# Patient Record
Sex: Female | Born: 1970 | Race: White | Hispanic: No | Marital: Single | Smoking: Never smoker
Health system: Southern US, Community
[De-identification: ages and names within clinical notes are randomized; demographics above are authoritative.]

## PROBLEM LIST (undated history)

## (undated) DIAGNOSIS — N201 Calculus of ureter: Secondary | ICD-10-CM

## (undated) DIAGNOSIS — Z9289 Personal history of other medical treatment: Secondary | ICD-10-CM

## (undated) DIAGNOSIS — N189 Chronic kidney disease, unspecified: Secondary | ICD-10-CM

## (undated) DIAGNOSIS — I429 Cardiomyopathy, unspecified: Secondary | ICD-10-CM

## (undated) DIAGNOSIS — I10 Essential (primary) hypertension: Secondary | ICD-10-CM

## (undated) HISTORY — PX: CARPAL TUNNEL RELEASE: SHX101

---

## 1999-04-14 ENCOUNTER — Ambulatory Visit (HOSPITAL_BASED_OUTPATIENT_CLINIC_OR_DEPARTMENT_OTHER): Admission: RE | Admit: 1999-04-14 | Discharge: 1999-04-14 | Payer: Self-pay | Admitting: Orthopedic Surgery

## 2020-05-15 ENCOUNTER — Other Ambulatory Visit: Payer: Self-pay | Admitting: Nephrology

## 2020-05-15 ENCOUNTER — Other Ambulatory Visit (HOSPITAL_COMMUNITY): Payer: Self-pay | Admitting: Nephrology

## 2020-05-15 DIAGNOSIS — R600 Localized edema: Secondary | ICD-10-CM

## 2020-05-15 DIAGNOSIS — R7989 Other specified abnormal findings of blood chemistry: Secondary | ICD-10-CM

## 2020-05-15 DIAGNOSIS — I129 Hypertensive chronic kidney disease with stage 1 through stage 4 chronic kidney disease, or unspecified chronic kidney disease: Secondary | ICD-10-CM

## 2020-05-15 DIAGNOSIS — R809 Proteinuria, unspecified: Secondary | ICD-10-CM

## 2020-05-22 ENCOUNTER — Telehealth: Payer: Self-pay | Admitting: Oncology

## 2020-05-22 NOTE — Telephone Encounter (Signed)
Ms. Laton returned my call and has been scheduled to see Dr. Alen Blew on 2/4 at 11am. Pt aware to arrive 20 minutes early.

## 2020-06-06 ENCOUNTER — Ambulatory Visit
Admission: RE | Admit: 2020-06-06 | Discharge: 2020-06-06 | Disposition: A | Discharge status: Home / Self Care | Payer: Self-pay | Source: Ambulatory Visit | Attending: Nephrology | Admitting: Nephrology

## 2020-06-06 ENCOUNTER — Inpatient Hospital Stay: Payer: 59 | Attending: Oncology | Admitting: Oncology

## 2020-06-06 ENCOUNTER — Other Ambulatory Visit: Payer: Self-pay

## 2020-06-06 VITALS — BP 178/93 | HR 62 | Temp 97.6°F | Resp 19 | Wt 177.3 lb

## 2020-06-06 DIAGNOSIS — N19 Unspecified kidney failure: Secondary | ICD-10-CM | POA: Diagnosis not present

## 2020-06-06 DIAGNOSIS — I429 Cardiomyopathy, unspecified: Secondary | ICD-10-CM | POA: Diagnosis not present

## 2020-06-06 DIAGNOSIS — I10 Essential (primary) hypertension: Secondary | ICD-10-CM | POA: Diagnosis not present

## 2020-06-06 DIAGNOSIS — R7989 Other specified abnormal findings of blood chemistry: Secondary | ICD-10-CM

## 2020-06-06 DIAGNOSIS — R778 Other specified abnormalities of plasma proteins: Secondary | ICD-10-CM

## 2020-06-06 DIAGNOSIS — Z856 Personal history of leukemia: Secondary | ICD-10-CM

## 2020-06-06 DIAGNOSIS — R809 Proteinuria, unspecified: Secondary | ICD-10-CM

## 2020-06-06 DIAGNOSIS — Z806 Family history of leukemia: Secondary | ICD-10-CM | POA: Diagnosis not present

## 2020-06-06 NOTE — Progress Notes (Signed)
Reason for the request:    Abnormal serum light chains  HPI: I was asked by Dr. Candiss Norse to evaluate Heidi Harrison the evaluation of abnormal protein studies.  This is a 50 year old woman with a history of hypertension and cardiomyopathy who was evaluated by Dr. Candiss Norse at [facility] for renal insufficiency.  She noted symptoms of lower extremity edema and work-up showed a creatinine of 2.77 and a calcium of 10.4.  She had normal protein and albumin studies as well as a normal serum protein electrophoresis.  She had a normal CBC including hemoglobin of 14.8, white cell count of 8.6 and platelet count of 370.  Her light chain immunoglobulins showed an elevated free kappa of 119 and free lambda at 89.2.  The kappa to lambda ratio is normal at 1.34.   Clinically, she has reported improvement in her lower extremity edema and otherwise no other complaints.  She denies any bone pain, pathological fractures or recent hospitalizations.  She does not report any headaches, blurry vision, syncope or seizures. Does not report any fevers, chills or sweats.  Does not report any cough, wheezing or hemoptysis.  Does not report any chest pain, palpitation, orthopnea or leg edema.  Does not report any nausea, vomiting or abdominal pain.  Does not report any constipation or diarrhea.  Does not report any skeletal complaints.    Does not report frequency, urgency or hematuria.  Does not report any skin rashes or lesions. Does not report any heat or cold intolerance.  Does not report any lymphadenopathy or petechiae.  Does not report any anxiety or depression.  Remaining review of systems is negative.    Past medical history significant for hypertension and cardiomegaly.  Medications: She is currently on lisinopril and hydrochlorothiazide.    Her mother has leukemia but no other malignancy noted.  Social History   Socioeconomic History  . Marital status: Single    Spouse name: Not on file  . Number of  children: Not on file  . Years of education: Not on file  . Highest education level: Not on file  Occupational History  . Not on file  Tobacco Use  . Smoking status: Not on file  . Smokeless tobacco: Not on file  Substance and Sexual Activity  . Alcohol use: Not on file  . Drug use: Not on file  . Sexual activity: Not on file  Other Topics Concern  . Not on file  Social History Narrative  . Not on file   Social Determinants of Health   Financial Resource Strain: Not on file  Food Insecurity: Not on file  Transportation Needs: Not on file  Physical Activity: Not on file  Stress: Not on file  Social Connections: Not on file  Intimate Partner Violence: Not on file  :    Exam: Blood pressure (!) 178/93, pulse 62, temperature 97.6 F (36.4 C), temperature source Tympanic, resp. rate 19, weight 177 lb 4.8 oz (80.4 kg), SpO2 99 %.  ECOG 0 General appearance: alert and cooperative appeared without distress. Head: atraumatic without any abnormalities. Eyes: conjunctivae/corneas clear. PERRL.  Sclera anicteric. Throat: lips, mucosa, and tongue normal; without oral thrush or ulcers. Resp: clear to auscultation bilaterally without rhonchi, wheezes or dullness to percussion. Cardio: regular rate and rhythm, S1, S2 normal, no murmur, click, rub or gallop GI: soft, non-tender; bowel sounds normal; no masses,  no organomegaly Skin: Skin color, texture, turgor normal. No rashes or lesions Lymph nodes: Cervical, supraclavicular, and axillary nodes normal.  Neurologic: Grossly normal without any motor, sensory or deep tendon reflexes. Musculoskeletal: No joint deformity or effusion.    Assessment and Plan:   50 year old with:  1.  Elevated serum kappa and lambda light chains detected in January 2022.  This is in the setting of worsening renal failure and abnormal serum protein electrophoresis.  Her kappa light chains were 119 and free lambda at 89.2.  The kappa to lambda ratio is  normal at 1.34.  He has normal CBC, albumin and protein.  The natural course of these findings as well as a differential diagnosis was discussed at this time.  The pattern of elevation in her light chains is likely consistent with reactive findings rather than a plasma cell disorder.  Conditions such as MGUS multiple myeloma or amyloidosis are considered less likely.  Given her renal failure and proteinuria it is common to see these findings manifesting without a plasma cell disorder.  For completeness sake, I will repeat these testing in 6 months and reevaluate at this time for potential plasma cell disorder.  Her labs are consistent with these findings, no further hematological work-up including a bone marrow biopsy or skeletal survey is needed.  She is agreeable with this plan and understands management choices for the future.  2.  Renal failure: Unrelated to plasma cell disorder likely related to longstanding hypertension.  3.  Follow-up: Will be in 6 months after repeat laboratory testing.   45  minutes were dedicated to this visit. The time was spent on reviewing laboratory data, discussing treatment options, discussing differential diagnosis and answering questions regarding future plan.     A copy of this consult has been forwarded to the requesting physician.

## 2020-06-23 ENCOUNTER — Other Ambulatory Visit: Payer: Self-pay | Admitting: Nephrology

## 2020-06-23 DIAGNOSIS — N1330 Unspecified hydronephrosis: Secondary | ICD-10-CM

## 2020-06-26 ENCOUNTER — Inpatient Hospital Stay: Admission: RE | Admit: 2020-06-26 | Payer: 59 | Source: Ambulatory Visit

## 2020-07-25 ENCOUNTER — Other Ambulatory Visit (HOSPITAL_COMMUNITY): Payer: Self-pay | Admitting: Urology

## 2020-07-25 ENCOUNTER — Ambulatory Visit
Admission: RE | Admit: 2020-07-25 | Discharge: 2020-07-25 | Disposition: A | Discharge status: Home / Self Care | Payer: 59 | Source: Ambulatory Visit | Attending: Nephrology | Admitting: Nephrology

## 2020-07-25 ENCOUNTER — Other Ambulatory Visit: Payer: Self-pay | Admitting: Urology

## 2020-07-25 DIAGNOSIS — N1330 Unspecified hydronephrosis: Secondary | ICD-10-CM

## 2020-07-25 DIAGNOSIS — N130 Hydronephrosis with ureteropelvic junction obstruction: Secondary | ICD-10-CM

## 2020-08-05 ENCOUNTER — Other Ambulatory Visit: Payer: Self-pay

## 2020-08-05 ENCOUNTER — Ambulatory Visit (HOSPITAL_COMMUNITY)
Admission: RE | Admit: 2020-08-05 | Discharge: 2020-08-05 | Disposition: A | Discharge status: Home / Self Care | Payer: 59 | Source: Ambulatory Visit | Attending: Urology | Admitting: Urology

## 2020-08-05 DIAGNOSIS — N130 Hydronephrosis with ureteropelvic junction obstruction: Secondary | ICD-10-CM | POA: Insufficient documentation

## 2020-08-05 MED ORDER — TECHNETIUM TC 99M MERTIATIDE
4.8000 | Freq: Once | INTRAVENOUS | Status: AC
  Administered 2020-08-05: 4.8 via INTRAVENOUS

## 2020-08-05 MED ORDER — FUROSEMIDE 10 MG/ML IJ SOLN
INTRAMUSCULAR | Status: AC
  Administered 2020-08-05: 40 mg via INTRAVENOUS
  Filled 2020-08-05: qty 4

## 2020-08-05 MED ORDER — FUROSEMIDE 10 MG/ML IJ SOLN: 40.0000 mg | Freq: Once | INTRAMUSCULAR | Status: AC

## 2020-08-13 ENCOUNTER — Other Ambulatory Visit: Payer: Self-pay | Admitting: Urology

## 2020-08-27 ENCOUNTER — Other Ambulatory Visit: Payer: Self-pay

## 2020-08-27 ENCOUNTER — Encounter (HOSPITAL_BASED_OUTPATIENT_CLINIC_OR_DEPARTMENT_OTHER): Payer: Self-pay | Admitting: Urology

## 2020-08-27 NOTE — Progress Notes (Signed)
Spoke w/ via phone for pre-op interview---pt Lab needs dos----    I stat,  urine poct           Lab results------see below COVID test ------08-29-2020 830 Arrive at -------1200 pm 09-01-2020 NPO after MN NO Solid Food.  Clear liquids from MN until---1100 am then npo Med rec completed Medications to take morning of surgery -----labetalol, diltiazem Diabetic medication ----- n/a Patient instructed to bring photo id and insurance card day of surgery Patient aware to have Driver (ride ) / caregiver  Significant other beth spainhour will stay   for 24 hours after surgery  Patient Special Instructions -----none Pre-Op special Istructions -----none Patient verbalized understanding of instructions that were given at this phone interview. Patient denies shortness of breath, chest pain, fever, cough at this phone interview.  ekg 05-06-2020 [facility] cardiology on chart Echo bethany cardiology 06-10-2020 on chart lov dr Trina Ao cardiology 07-31-2020 on chart

## 2020-08-29 ENCOUNTER — Other Ambulatory Visit (HOSPITAL_COMMUNITY)
Admission: RE | Admit: 2020-08-29 | Discharge: 2020-08-29 | Disposition: A | Discharge status: Home / Self Care | Payer: 59 | Source: Ambulatory Visit | Attending: Urology | Admitting: Urology

## 2020-08-29 DIAGNOSIS — Z01812 Encounter for preprocedural laboratory examination: Secondary | ICD-10-CM | POA: Diagnosis present

## 2020-08-29 DIAGNOSIS — Z20822 Contact with and (suspected) exposure to covid-19: Secondary | ICD-10-CM | POA: Insufficient documentation

## 2020-08-29 LAB — SARS CORONAVIRUS 2 (TAT 6-24 HRS): SARS Coronavirus 2: NEGATIVE

## 2020-09-01 ENCOUNTER — Other Ambulatory Visit: Payer: Self-pay

## 2020-09-01 ENCOUNTER — Encounter (HOSPITAL_BASED_OUTPATIENT_CLINIC_OR_DEPARTMENT_OTHER)
Admission: RE | Disposition: A | Discharge status: Home / Self Care | Payer: Self-pay | Source: Home / Self Care | Attending: Urology

## 2020-09-01 ENCOUNTER — Ambulatory Visit (HOSPITAL_BASED_OUTPATIENT_CLINIC_OR_DEPARTMENT_OTHER)
Admission: RE | Admit: 2020-09-01 | Discharge: 2020-09-01 | Disposition: A | Discharge status: Home / Self Care | Payer: 59 | Attending: Urology | Admitting: Urology

## 2020-09-01 ENCOUNTER — Ambulatory Visit (HOSPITAL_BASED_OUTPATIENT_CLINIC_OR_DEPARTMENT_OTHER): Payer: 59 | Admitting: Anesthesiology

## 2020-09-01 ENCOUNTER — Encounter (HOSPITAL_BASED_OUTPATIENT_CLINIC_OR_DEPARTMENT_OTHER): Payer: Self-pay | Admitting: Urology

## 2020-09-01 DIAGNOSIS — N132 Hydronephrosis with renal and ureteral calculous obstruction: Secondary | ICD-10-CM | POA: Insufficient documentation

## 2020-09-01 DIAGNOSIS — Z79899 Other long term (current) drug therapy: Secondary | ICD-10-CM | POA: Insufficient documentation

## 2020-09-01 HISTORY — DX: Chronic kidney disease, unspecified: N18.9

## 2020-09-01 HISTORY — DX: Personal history of other medical treatment: Z92.89

## 2020-09-01 HISTORY — PX: HOLMIUM LASER APPLICATION: SHX5852

## 2020-09-01 HISTORY — DX: Essential (primary) hypertension: I10

## 2020-09-01 HISTORY — DX: Calculus of ureter: N20.1

## 2020-09-01 HISTORY — DX: Cardiomyopathy, unspecified: I42.9

## 2020-09-01 HISTORY — PX: CYSTOSCOPY/URETEROSCOPY/HOLMIUM LASER/STENT PLACEMENT: SHX6546

## 2020-09-01 LAB — POCT I-STAT, CHEM 8
BUN: 56 mg/dL — ABNORMAL HIGH (ref 6–20)
Calcium, Ion: 1.22 mmol/L (ref 1.15–1.40)
Chloride: 99 mmol/L (ref 98–111)
Creatinine, Ser: 3.6 mg/dL — ABNORMAL HIGH (ref 0.44–1.00)
Glucose, Bld: 86 mg/dL (ref 70–99)
HCT: 42 % (ref 36.0–46.0)
Hemoglobin: 14.3 g/dL (ref 12.0–15.0)
Potassium: 3.3 mmol/L — ABNORMAL LOW (ref 3.5–5.1)
Sodium: 139 mmol/L (ref 135–145)
TCO2: 28 mmol/L (ref 22–32)

## 2020-09-01 LAB — POCT PREGNANCY, URINE: Preg Test, Ur: NEGATIVE

## 2020-09-01 SURGERY — CYSTOSCOPY/URETEROSCOPY/HOLMIUM LASER/STENT PLACEMENT
Anesthesia: General | Site: Renal | Laterality: Left

## 2020-09-01 MED ORDER — LIDOCAINE 2% (20 MG/ML) 5 ML SYRINGE
INTRAMUSCULAR | Status: AC
  Filled 2020-09-01: qty 5

## 2020-09-01 MED ORDER — SODIUM CHLORIDE 0.9 % IR SOLN
Status: DC | PRN
  Administered 2020-09-01 (×2): 3000 mL

## 2020-09-01 MED ORDER — MEPERIDINE HCL 25 MG/ML IJ SOLN: 6.2500 mg | INTRAMUSCULAR | Status: DC | PRN

## 2020-09-01 MED ORDER — DEXAMETHASONE SODIUM PHOSPHATE 10 MG/ML IJ SOLN
INTRAMUSCULAR | Status: AC
  Filled 2020-09-01: qty 1

## 2020-09-01 MED ORDER — PROPOFOL 10 MG/ML IV BOLUS
INTRAVENOUS | Status: DC | PRN
  Administered 2020-09-01: 50 mg via INTRAVENOUS
  Administered 2020-09-01: 150 mg via INTRAVENOUS

## 2020-09-01 MED ORDER — TAMSULOSIN HCL 0.4 MG PO CAPS: 0.4000 mg | ORAL_CAPSULE | Freq: Every day | ORAL | 1 refills | Status: DC

## 2020-09-01 MED ORDER — PROPOFOL 10 MG/ML IV BOLUS
INTRAVENOUS | Status: AC
  Filled 2020-09-01: qty 20

## 2020-09-01 MED ORDER — CEFAZOLIN SODIUM-DEXTROSE 2-4 GM/100ML-% IV SOLN
INTRAVENOUS | Status: AC
  Filled 2020-09-01: qty 100

## 2020-09-01 MED ORDER — LIDOCAINE 2% (20 MG/ML) 5 ML SYRINGE
INTRAMUSCULAR | Status: DC | PRN
  Administered 2020-09-01: 60 mg via INTRAVENOUS

## 2020-09-01 MED ORDER — HYDRALAZINE HCL 20 MG/ML IJ SOLN: 5.0000 mg | Freq: Once | INTRAMUSCULAR | Status: DC

## 2020-09-01 MED ORDER — HYDROCODONE-ACETAMINOPHEN 5-325 MG PO TABS: 1.0000 | ORAL_TABLET | ORAL | 0 refills | Status: DC | PRN

## 2020-09-01 MED ORDER — HYDRALAZINE HCL 20 MG/ML IJ SOLN
INTRAMUSCULAR | Status: AC
  Filled 2020-09-01: qty 1

## 2020-09-01 MED ORDER — LACTATED RINGERS IV SOLN: INTRAVENOUS | Status: DC

## 2020-09-01 MED ORDER — ARTIFICIAL TEARS OPHTHALMIC OINT
TOPICAL_OINTMENT | OPHTHALMIC | Status: AC
  Filled 2020-09-01: qty 3.5

## 2020-09-01 MED ORDER — FENTANYL CITRATE (PF) 100 MCG/2ML IJ SOLN
INTRAMUSCULAR | Status: AC
  Filled 2020-09-01: qty 2

## 2020-09-01 MED ORDER — CEFAZOLIN SODIUM-DEXTROSE 2-4 GM/100ML-% IV SOLN
2.0000 g | INTRAVENOUS | Status: AC
  Administered 2020-09-01: 2 g via INTRAVENOUS

## 2020-09-01 MED ORDER — FENTANYL CITRATE (PF) 100 MCG/2ML IJ SOLN
INTRAMUSCULAR | Status: DC | PRN
  Administered 2020-09-01 (×2): 50 ug via INTRAVENOUS

## 2020-09-01 MED ORDER — LABETALOL HCL 5 MG/ML IV SOLN
INTRAVENOUS | Status: AC
  Filled 2020-09-01: qty 4

## 2020-09-01 MED ORDER — OXYCODONE HCL 5 MG PO TABS: 5.0000 mg | ORAL_TABLET | Freq: Once | ORAL | Status: DC | PRN

## 2020-09-01 MED ORDER — MIDAZOLAM HCL 2 MG/2ML IJ SOLN
INTRAMUSCULAR | Status: DC | PRN
  Administered 2020-09-01 (×2): 1 mg via INTRAVENOUS

## 2020-09-01 MED ORDER — ACETAMINOPHEN 160 MG/5ML PO SOLN: 325.0000 mg | ORAL | Status: DC | PRN

## 2020-09-01 MED ORDER — ONDANSETRON HCL 4 MG/2ML IJ SOLN
INTRAMUSCULAR | Status: AC
  Filled 2020-09-01: qty 2

## 2020-09-01 MED ORDER — OXYCODONE HCL 5 MG/5ML PO SOLN: 5.0000 mg | Freq: Once | ORAL | Status: DC | PRN

## 2020-09-01 MED ORDER — MIDAZOLAM HCL 2 MG/2ML IJ SOLN
INTRAMUSCULAR | Status: AC
  Filled 2020-09-01: qty 2

## 2020-09-01 MED ORDER — FENTANYL CITRATE (PF) 100 MCG/2ML IJ SOLN: 25.0000 ug | INTRAMUSCULAR | Status: DC | PRN

## 2020-09-01 MED ORDER — IOHEXOL 300 MG/ML  SOLN
INTRAMUSCULAR | Status: DC | PRN
  Administered 2020-09-01: 10 mL

## 2020-09-01 MED ORDER — ACETAMINOPHEN 325 MG PO TABS: 325.0000 mg | ORAL_TABLET | ORAL | Status: DC | PRN

## 2020-09-01 MED ORDER — DEXAMETHASONE SODIUM PHOSPHATE 10 MG/ML IJ SOLN
INTRAMUSCULAR | Status: DC | PRN
  Administered 2020-09-01: 10 mg via INTRAVENOUS

## 2020-09-01 MED ORDER — ONDANSETRON HCL 4 MG/2ML IJ SOLN
INTRAMUSCULAR | Status: DC | PRN
  Administered 2020-09-01: 4 mg via INTRAVENOUS

## 2020-09-01 MED ORDER — ONDANSETRON HCL 4 MG/2ML IJ SOLN: 4.0000 mg | Freq: Once | INTRAMUSCULAR | Status: DC | PRN

## 2020-09-01 MED ORDER — LABETALOL HCL 5 MG/ML IV SOLN
20.0000 mg | Freq: Once | INTRAVENOUS | Status: AC
  Administered 2020-09-01: 20 mg via INTRAVENOUS

## 2020-09-01 MED ORDER — PHENYLEPHRINE 40 MCG/ML (10ML) SYRINGE FOR IV PUSH (FOR BLOOD PRESSURE SUPPORT)
PREFILLED_SYRINGE | INTRAVENOUS | Status: AC
  Filled 2020-09-01: qty 10

## 2020-09-01 SURGICAL SUPPLY — 25 items
BAG DRAIN URO-CYSTO SKYTR STRL (DRAIN) ×2 IMPLANT
BAG DRN UROCATH (DRAIN) ×1
BASKET ZERO TIP NITINOL 2.4FR (BASKET) ×2 IMPLANT
BSKT STON RTRVL ZERO TP 2.4FR (BASKET) ×1
CATH INTERMIT  6FR 70CM (CATHETERS) ×2 IMPLANT
CLOTH BEACON ORANGE TIMEOUT ST (SAFETY) ×2 IMPLANT
FIBER LASER FLEXIVA 365 (UROLOGICAL SUPPLIES) IMPLANT
GLOVE SURG ENC MOIS LTX SZ7.5 (GLOVE) ×2 IMPLANT
GLOVE SURG LTX SZ6.5 (GLOVE) ×4 IMPLANT
GLOVE SURG UNDER POLY LF SZ6.5 (GLOVE) ×4 IMPLANT
GOWN STRL REUS W/TWL LRG LVL3 (GOWN DISPOSABLE) ×2 IMPLANT
GOWN STRL REUS W/TWL XL LVL3 (GOWN DISPOSABLE) ×2 IMPLANT
GUIDEWIRE STR DUAL SENSOR (WIRE) ×2 IMPLANT
GUIDEWIRE ZIPWRE .038 STRAIGHT (WIRE) ×2 IMPLANT
IV NS IRRIG 3000ML ARTHROMATIC (IV SOLUTION) ×4 IMPLANT
KIT TURNOVER CYSTO (KITS) ×2 IMPLANT
MANIFOLD NEPTUNE II (INSTRUMENTS) ×2 IMPLANT
NS IRRIG 500ML POUR BTL (IV SOLUTION) ×2 IMPLANT
PACK CYSTO (CUSTOM PROCEDURE TRAY) ×2 IMPLANT
SHEATH URET ACCESS 12FR/35CM (UROLOGICAL SUPPLIES) ×2 IMPLANT
STENT URET 6FRX24 CONTOUR (STENTS) ×2 IMPLANT
TRACTIP FLEXIVA PULS ID 200XHI (Laser) ×1 IMPLANT
TRACTIP FLEXIVA PULSE ID 200 (Laser) ×2
TUBE CONNECTING 12X1/4 (SUCTIONS) ×2 IMPLANT
TUBING UROLOGY SET (TUBING) ×2 IMPLANT

## 2020-09-01 NOTE — Progress Notes (Signed)
Dr Ambrose Pancoast aware of BP 207/100 and meds taken ttoday. Orders given.

## 2020-09-01 NOTE — Anesthesia Preprocedure Evaluation (Addendum)
Anesthesia Evaluation  Patient identified by MRN, date of birth, ID band Patient awake    Reviewed: Allergy & Precautions, H&P , NPO status , Patient's Chart, lab work & pertinent test results, reviewed documented beta blocker date and time   Airway Mallampati: II  TM Distance: >3 FB Neck ROM: full    Dental no notable dental hx. (+) Teeth Intact, Dental Advisory Given, Implants,    Pulmonary    Pulmonary exam normal breath sounds clear to auscultation       Cardiovascular Exercise Tolerance: Good hypertension, Pt. on medications and Pt. on home beta blockers  Rhythm:regular Rate:Normal  ECHO 21'  Concentric LVH with EF 70% Valves ok   Neuro/Psych negative psych ROS   GI/Hepatic   Endo/Other    Renal/GU Renal disease     Musculoskeletal   Abdominal (+) + obese,   Peds  Hematology   Anesthesia Other Findings   Reproductive/Obstetrics                           Anesthesia Physical Anesthesia Plan  ASA: III  Anesthesia Plan: General   Post-op Pain Management:    Induction: Intravenous  PONV Risk Score and Plan: 3 and Ondansetron and Dexamethasone  Airway Management Planned: LMA  Additional Equipment: None  Intra-op Plan:   Post-operative Plan:   Informed Consent: I have reviewed the patients History and Physical, chart, labs and discussed the procedure including the risks, benefits and alternatives for the proposed anesthesia with the patient or authorized representative who has indicated his/her understanding and acceptance.     Dental Advisory Given  Plan Discussed with: CRNA and Anesthesiologist  Anesthesia Plan Comments: ( )        Anesthesia Quick Evaluation

## 2020-09-01 NOTE — Op Note (Signed)
Operative Note  Preoperative diagnosis:  1.  Left ureteral calculus  Postoperative diagnosis: 1.  Left ureteral calculus  Procedure(s): 1.  Cystoscopy with left retrograde pyelogram, left ureteroscopy with laser lithotripsy and stone basketing, ureteral stent placement  Surgeon: Link Snuffer, MD  Assistants: None  Anesthesia: General  Complications: None immediate  EBL: Minimal  Specimens: 1.  None  Drains/Catheters: 1.  6 x 24 double-J ureteral stent  Intraoperative findings: 1.  Normal urethra and bladder 2.  Left retrograde pyelogram revealed severe hydronephrosis. 3.  Ureteroscopy revealed a significantly impacted stone in the proximal ureter with significant periureteral inflammation.  There was severe hydronephrosis within the kidney.  I did not ever visualize a lower pole calculus but all visualized stone was fragmented to tiny fragments and larger fragments were basket extracted.  Indication: 50 year old female with renal insufficiency underwent ultrasound that revealed bilateral hydronephrosis.  She underwent subsequent CT scan that revealed no hydronephrosis on the right but she had severe hydronephrosis on the left with an obstructing 2 cm ureteropelvic junction calculus.  She presents for the previously mentioned operation.  Description of procedure:  The patient was identified and consent was obtained.  The patient was taken to the operating room and placed in the supine position.  The patient was placed under general anesthesia.  Perioperative antibiotics were administered.  The patient was placed in dorsal lithotomy.  Patient was prepped and draped in a standard sterile fashion and a timeout was performed.  A 21 French rigid cystoscope was advanced into the urethra and into the bladder.  Complete cystoscopy was performed with no abnormal findings.  A Glidewire was advanced up the ureter and into the kidney under fluoroscopic guidance.  Semirigid ureteroscopy was  performed up to the stone of interest which was dusted to smaller fragments.  Once the ureter was clear, I advanced a sensor wire through the scope and into the kidney.  I secured that wire to the drape after withdrawing the scope.  I advanced a 12 x 14 access sheath over the Glidewire under continuous fluoroscopic guidance and withdrew the inner sheath along with the wire.  Digital ureteroscopy was performed and retropulsed larger stones were dusted to small fragments.  I inspected the lower pole of the kidney and did not ever identify a lower pole calculus.  All stone that identified were dusted to small fragments.  I shot a retrograde pyelogram through the scope with findings noted above.  I then used a basket to basket extract any larger fragments including a couple of 2 mm fragments within the ureter.  There was no injury noted along the course of the ureter.  I backloaded the wire onto the cystoscope and advanced that into the bladder followed by routine placement of a 6 x 24 double-J ureteral stent.  Fluoroscopy confirmed proximal placement and direct visualization confirmed a good coil within the bladder.  I drained the bladder and withdrew the scope.  Patient tolerated the procedure well and was stable postoperative.  Plan: Return in 1 week for stent removal.

## 2020-09-01 NOTE — Transfer of Care (Signed)
  Immediate Anesthesia Transfer of Care Note  Patient: [employer]  Procedure(s) Performed: Procedure(s) (LRB): CYSTOSCOPY LEFT URETEROSCOPY/ RETROGRADE / HOLMIUM LASER LITHOTRIPSY/ STONE BASKETRY/ STENT PLACEMENT (Left) HOLMIUM LASER APPLICATION (Left)  Patient Location: PACU  Anesthesia Type: General  Level of Consciousness: awake, alert  and oriented  Airway & Oxygen Therapy: Patient Spontanous Breathing and Patient connected to nasal cannula oxygen  Post-op Assessment: Report given to PACU RN and Post -op Vital signs reviewed and stable  Post vital signs: Reviewed and stable  Complications: No apparent anesthesia complications Last Vitals:  Vitals Value Taken Time  BP    Temp    Pulse 59 09/01/20 1500  Resp 15 09/01/20 1500  SpO2 98 % 09/01/20 1500  Vitals shown include unvalidated device data.  Last Pain:  Vitals:   09/01/20 1250  TempSrc: Oral         Complications: No complications documented.

## 2020-09-01 NOTE — Discharge Instructions (Addendum)
Post Anesthesia Home Care Instructions  Activity: Get plenty of rest for the remainder of the day. A responsible adult should stay with you for 24 hours following the procedure.  For the next 24 hours, DO NOT: -Drive a car -Paediatric nurse -Drink alcoholic beverages -Take any medication unless instructed by your physician -Make any legal decisions or sign important papers.  Meals: Start with liquid foods such as gelatin or soup. Progress to regular foods as tolerated. Avoid greasy, spicy, heavy foods. If nausea and/or vomiting occur, drink only clear liquids until the nausea and/or vomiting subsides. Call your physician if vomiting continues.  Special Instructions/Symptoms: Your throat may feel dry or sore from the anesthesia or the breathing tube placed in your throat during surgery. If this causes discomfort, gargle with warm salt water. The discomfort should disappear within 24 hours.       [facility] [phone] Post Ureteroscopy With or Without Stent Instructions  Definitions:  Ureter: The duct that transports urine from the kidney to the bladder. Stent:   A plastic hollow tube that is placed into the ureter, from the kidney to the                 bladder to prevent the ureter from swelling shut.  GENERAL INSTRUCTIONS:  Despite the fact that no skin incisions were used, the area around the ureter and bladder is raw and irritated. The stent is a foreign body which will further irritate the bladder wall. This irritation is manifested by increased frequency of urination, both day and night, and by an increase in the urge to urinate. In some, the urge to urinate is present almost always. Sometimes the urge is strong enough that you may not be able to stop yourself from urinating. The only real cure is to remove the stent and then give time for the bladder wall to heal which can't be done until the danger of the ureter swelling shut has passed, which  varies.  You may see some blood in your urine while the stent is in place and a few days afterwards. Do not be alarmed, even if the urine was clear for a while. Get off your feet and drink lots of fluids until clearing occurs. If you start to pass clots or don't improve, call us.  DIET: You may return to your normal diet immediately. Because of the raw surface of your bladder, alcohol, spicy foods, acid type foods and drinks with caffeine may cause irritation or frequency and should be used in moderation. To keep your urine flowing freely and to avoid constipation, drink plenty of fluids during the day ( 8-10 glasses ). Tip: Avoid cranberry juice because it is very acidic.  ACTIVITY: Your physical activity doesn't need to be restricted. However, if you are very active, you may see some blood in your urine. We suggest that you reduce your activity under these circumstances until the bleeding has stopped.  BOWELS: It is important to keep your bowels regular during the postoperative period. Straining with bowel movements can cause bleeding. A bowel movement every other day is reasonable. Use a mild laxative if needed, such as Milk of Magnesia 2-3 tablespoons, or 2 Dulcolax tablets. Call if you continue to have problems. If you have been taking narcotics for pain, before, during or after your surgery, you may be constipated. Take a laxative if necessary.   MEDICATION: You should resume your pre-surgery medications unless told not to. You may take oxybutynin or flomax if  prescribed for bladder spasms or discomfort from the stent Take pain medication as directed for pain refractory to conservative management  PROBLEMS YOU SHOULD REPORT TO [place]:  Fevers over 100.5 Fahrenheit.  Heavy bleeding, or clots ( See above notes about blood in urine ).  Inability to urinate.  Drug reactions ( hives, rash, nausea, vomiting, diarrhea ).  Severe burning or pain with urination that is not improving.

## 2020-09-01 NOTE — Anesthesia Procedure Notes (Signed)
Procedure Name: LMA Insertion Date/Time: 09/01/2020 1:27 PM Performed by: Mechele Claude, CRNA Pre-anesthesia Checklist: Patient identified, Emergency Drugs available, Suction available and Patient being monitored Patient Re-evaluated:Patient Re-evaluated prior to induction Oxygen Delivery Method: Circle system utilized Preoxygenation: Pre-oxygenation with 100% oxygen Induction Type: IV induction Ventilation: Mask ventilation without difficulty LMA: LMA inserted LMA Size: 4.0 Number of attempts: 1 Airway Equipment and Method: Bite block Placement Confirmation: positive ETCO2 Tube secured with: Tape Dental Injury: Teeth and Oropharynx as per pre-operative assessment

## 2020-09-01 NOTE — Progress Notes (Signed)
Dr. Ambrose Pancoast aware of BP 183/89 after labetalol. No additional ordres at this time.

## 2020-09-01 NOTE — H&P (Signed)
H&P Chief Complaint: Renal insufficiency, left obstructing stone  History of Present Illness: 50 year old female with a elevated creatinine of 3.6 was found to have bilateral hydronephrosis on ultrasound.  She underwent a follow-up CT of the abdomen and pelvis without contrast that revealed an obstructing 2 cm ureteropelvic junction calculus on the left and a smaller calculus in the left lower pole with severe hydronephrosis.  There was some parenchymal thinning.  There was no hydronephrosis on the right.  There was poor uptake on the renogram on the right.  She presents for left ureteroscopy with laser lithotripsy and ureteral stent placement.  Past Medical History:  Diagnosis Date  . Cardiomyopathy ([place])   . Chronic kidney disease   . 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 Hypertension   . Left ureteral stone    Past Surgical History:  Procedure Laterality Date  . CARPAL TUNNEL RELEASE Right 30 yrs ago    Home Medications:  Medications Prior to Admission  Medication Sig Dispense Refill Last Dose  . chlorthalidone (HYGROTON) 25 MG tablet Take 12.5 mg by mouth daily.   08/31/2020 at Unknown time  . diltiazem (TIAZAC) 240 MG 24 hr capsule Take 240 mg by mouth daily.   09/01/2020 at 0800  . furosemide (LASIX) 40 MG tablet Take 40 mg by mouth.   08/31/2020 at Unknown time  . labetalol (NORMODYNE) 200 MG tablet Take 200 mg by mouth 2 (two) times daily.   08/31/2020 at 1900   Allergies: No Known Allergies  History reviewed. No pertinent family history. Social History:  reports that she has never smoked. She has never used smokeless tobacco. She reports current alcohol use. She reports that she does not use drugs.  ROS: A complete review of systems was performed.  All systems are negative except for pertinent findings as noted. ROS   Physical Exam:  Vital signs in last 24 hours: Temp:  [98.2 F (36.8 C)] 98.2 F (36.8 C) (05/02 1230) Pulse Rate:   [64] 64 (05/02 1230) Resp:  [18] 18 (05/02 1230) BP: (183-209)/(89-100) 183/89 (05/02 1300) SpO2:  [99 %] 99 % (05/02 1230) Weight:  [80.6 kg] 80.6 kg (05/02 1230) General:  Alert and oriented, No acute distress HEENT: Normocephalic, atraumatic Neck: No JVD or lymphadenopathy Cardiovascular: Regular rate and rhythm Lungs: Regular rate and effort Abdomen: Soft, nontender, nondistended, no abdominal masses Back: No CVA tenderness Extremities: No edema Neurologic: Grossly intact  Laboratory Data:  Results for orders placed or performed during the hospital encounter of 09/01/20 (from the past 24 hour(s))  Pregnancy, urine POC     Status: None   Collection Time: 09/01/20 12:15 PM  Result Value Ref Range   Preg Test, Ur NEGATIVE NEGATIVE  I-STAT, chem 8     Status: Abnormal   Collection Time: 09/01/20 12:39 PM  Result Value Ref Range   Sodium 139 135 - 145 mmol/L   Potassium 3.3 (L) 3.5 - 5.1 mmol/L   Chloride 99 98 - 111 mmol/L   BUN 56 (H) 6 - 20 mg/dL   Creatinine, Ser 3.60 (H) 0.44 - 1.00 mg/dL   Glucose, Bld 86 70 - 99 mg/dL   Calcium, Ion 1.22 1.15 - 1.40 mmol/L   TCO2 28 22 - 32 mmol/L   Hemoglobin 14.3 12.0 - 15.0 g/dL   HCT 42.0 36.0 - 46.0 %   Recent Results (from the past 240 hour(s))  SARS CORONAVIRUS 2 (TAT 6-24 HRS) Nasopharyngeal Nasopharyngeal  Swab     Status: None   Collection Time: 08/29/20  8:50 AM   Specimen: Nasopharyngeal Swab  Result Value Ref Range Status   SARS Coronavirus 2 NEGATIVE NEGATIVE Final    Comment: (NOTE) SARS-CoV-2 target nucleic acids are NOT DETECTED.  The SARS-CoV-2 RNA is generally detectable in upper and lower respiratory specimens during the acute phase of infection. Negative results do not preclude SARS-CoV-2 infection, do not rule out co-infections with other pathogens, and should not be used as the sole basis for treatment or other patient management decisions. Negative results must be combined with clinical  observations, patient history, and epidemiological information. The expected result is Negative.  Fact Sheet for Patients: SugarRoll.be  Fact Sheet for Healthcare Providers: https://www.woods-mathews.com/  This test is not yet approved or cleared by the [place] FDA and  has been authorized for detection and/or diagnosis of SARS-CoV-2 by FDA under an Emergency Use Authorization (EUA). This EUA will remain  in effect (meaning this test can be used) for the duration of the COVID-19 declaration under Se ction 564(b)(1) of the Act, 21 U.S.C. section 360bbb-3(b)(1), unless the authorization is terminated or revoked sooner.  Performed at [facility], [place] [address], [place], [place] [address]    Creatinine: Recent Labs    09/01/20 1239  CREATININE 3.60*    Impression/Assessment:  Left ureteral calculus  Plan:  Proceed with left ureteroscopy with laser lithotripsy and ureteral stent placement.  Risk benefits discussed.  These include but are not limited to bleeding, infection, injury to surrounding structures, need for additional procedure, possible staged procedure, possible ureteral evulsion.  She wishes to proceed.  Marton Redwood, III 09/01/2020, 1:13 PM

## 2020-09-02 ENCOUNTER — Encounter (HOSPITAL_BASED_OUTPATIENT_CLINIC_OR_DEPARTMENT_OTHER): Payer: Self-pay | Admitting: Urology

## 2020-09-02 NOTE — Anesthesia Postprocedure Evaluation (Signed)
Anesthesia Post Note  Patient: Adult nurse  Procedure(s) Performed: CYSTOSCOPY LEFT URETEROSCOPY/ RETROGRADE / HOLMIUM LASER LITHOTRIPSY/ STONE BASKETRY/ STENT PLACEMENT (Left Renal) HOLMIUM LASER APPLICATION (Left Renal)     Patient location during evaluation: PACU Anesthesia Type: General Level of consciousness: awake and alert Pain management: pain level controlled Vital Signs Assessment: post-procedure vital signs reviewed and stable Respiratory status: spontaneous breathing, nonlabored ventilation, respiratory function stable and patient connected to nasal cannula oxygen Cardiovascular status: blood pressure returned to baseline and stable Postop Assessment: no apparent nausea or vomiting Anesthetic complications: no   No complications documented.  Last Vitals:  Vitals:   09/01/20 1635 09/01/20 1723  BP:  (!) 176/89  Pulse: 71 70  Resp: 16 18  Temp: 36.8 C   SpO2: 95% 98%    Last Pain:  Vitals:   09/01/20 1723  TempSrc:   PainSc: 0-No pain                 Jadin Kagel

## 2020-09-02 NOTE — Anesthesia Postprocedure Evaluation (Signed)
Anesthesia Post Note  Patient: Adult nurse  Procedure(s) Performed: CYSTOSCOPY LEFT URETEROSCOPY/ RETROGRADE / HOLMIUM LASER LITHOTRIPSY/ STONE BASKETRY/ STENT PLACEMENT (Left Renal) HOLMIUM LASER APPLICATION (Left Renal)     Patient location during evaluation: PACU Anesthesia Type: General Level of consciousness: awake and alert Pain management: pain level controlled Vital Signs Assessment: post-procedure vital signs reviewed and stable Respiratory status: spontaneous breathing, nonlabored ventilation, respiratory function stable and patient connected to nasal cannula oxygen Cardiovascular status: blood pressure returned to baseline and stable Postop Assessment: no apparent nausea or vomiting Anesthetic complications: no   No complications documented.  Last Vitals:  Vitals:   09/01/20 1635 09/01/20 1723  BP:  (!) 176/89  Pulse: 71 70  Resp: 16 18  Temp: 36.8 C   SpO2: 95% 98%    Last Pain:  Vitals:   09/01/20 1723  TempSrc:   PainSc: 0-No pain                 Heidi Harrison

## 2020-12-05 ENCOUNTER — Inpatient Hospital Stay: Payer: 59 | Attending: Oncology

## 2020-12-11 ENCOUNTER — Telehealth: Payer: Self-pay | Admitting: Oncology

## 2020-12-11 ENCOUNTER — Telehealth: Payer: Self-pay | Admitting: *Deleted

## 2020-12-11 ENCOUNTER — Inpatient Hospital Stay: Payer: 59 | Admitting: Oncology

## 2020-12-11 NOTE — Telephone Encounter (Signed)
PC to patient regarding missed appointment, she was unaware.  Scheduling message sent to reschedule.

## 2020-12-11 NOTE — Telephone Encounter (Signed)
R/s appt per 8/11 sch msg. Pt aware.

## 2021-01-22 ENCOUNTER — Telehealth: Payer: Self-pay | Admitting: *Deleted

## 2021-01-22 ENCOUNTER — Other Ambulatory Visit: Payer: Self-pay

## 2021-01-22 ENCOUNTER — Inpatient Hospital Stay: Payer: 59 | Attending: Oncology | Admitting: Oncology

## 2021-01-22 ENCOUNTER — Inpatient Hospital Stay: Payer: 59

## 2021-01-22 VITALS — BP 175/98 | HR 58 | Temp 98.6°F | Resp 17 | Ht 63.0 in | Wt 184.3 lb

## 2021-01-22 DIAGNOSIS — N19 Unspecified kidney failure: Secondary | ICD-10-CM | POA: Diagnosis not present

## 2021-01-22 DIAGNOSIS — R778 Other specified abnormalities of plasma proteins: Secondary | ICD-10-CM

## 2021-01-22 DIAGNOSIS — R769 Abnormal immunological finding in serum, unspecified: Secondary | ICD-10-CM | POA: Diagnosis not present

## 2021-01-22 DIAGNOSIS — Z79899 Other long term (current) drug therapy: Secondary | ICD-10-CM | POA: Diagnosis not present

## 2021-01-22 LAB — CBC WITH DIFFERENTIAL (CANCER CENTER ONLY)
Abs Immature Granulocytes: 0.03 10*3/uL (ref 0.00–0.07)
Basophils Absolute: 0.1 10*3/uL (ref 0.0–0.1)
Basophils Relative: 1 %
Eosinophils Absolute: 0.3 10*3/uL (ref 0.0–0.5)
Eosinophils Relative: 3 %
HCT: 42.1 % (ref 36.0–46.0)
Hemoglobin: 14.2 g/dL (ref 12.0–15.0)
Immature Granulocytes: 0 %
Lymphocytes Relative: 17 %
Lymphs Abs: 1.7 10*3/uL (ref 0.7–4.0)
MCH: 29.2 pg (ref 26.0–34.0)
MCHC: 33.7 g/dL (ref 30.0–36.0)
MCV: 86.6 fL (ref 80.0–100.0)
Monocytes Absolute: 0.9 10*3/uL (ref 0.1–1.0)
Monocytes Relative: 8 %
Neutro Abs: 7 10*3/uL (ref 1.7–7.7)
Neutrophils Relative %: 71 %
Platelet Count: 336 10*3/uL (ref 150–400)
RBC: 4.86 MIL/uL (ref 3.87–5.11)
RDW: 13.2 % (ref 11.5–15.5)
WBC Count: 10.1 10*3/uL (ref 4.0–10.5)
nRBC: 0 % (ref 0.0–0.2)

## 2021-01-22 LAB — CMP (CANCER CENTER ONLY)
ALT: 10 U/L (ref 0–44)
AST: 13 U/L — ABNORMAL LOW (ref 15–41)
Albumin: 4.1 g/dL (ref 3.5–5.0)
Alkaline Phosphatase: 77 U/L (ref 38–126)
Anion gap: 15 (ref 5–15)
BUN: 48 mg/dL — ABNORMAL HIGH (ref 6–20)
CO2: 25 mmol/L (ref 22–32)
Calcium: 10 mg/dL (ref 8.9–10.3)
Chloride: 98 mmol/L (ref 98–111)
Creatinine: 3.14 mg/dL (ref 0.44–1.00)
GFR, Estimated: 17 mL/min — ABNORMAL LOW (ref >60)
Glucose, Bld: 106 mg/dL — ABNORMAL HIGH (ref 70–99)
Potassium: 3 mmol/L — ABNORMAL LOW (ref 3.5–5.1)
Sodium: 138 mmol/L (ref 135–145)
Total Bilirubin: 0.6 mg/dL (ref 0.3–1.2)
Total Protein: 8.7 g/dL — ABNORMAL HIGH (ref 6.5–8.1)

## 2021-01-22 NOTE — Progress Notes (Signed)
Hematology and Oncology Follow Up Visit  Heidi Harrison [id number] [birth year] 50 y.o. 01/22/2021 8:38 AM Heidi Harrison, Heidi Harrison, NPMcCoy, Heidi Schneiders, NP   Principle Diagnosis: 50 year old woman with abnormal serum light chain detected in January 2022.  She was found to have elevated both kappa and lambda levels.  This is related to renal failure less likely a plasma cell disorder.  Work-up is ongoing.     Current therapy: Active surveillance.  Interim History: Heidi Harrison returns today for a follow-up visit.  Since her last visit, she underwent cystoscopy and left ureteroscopy and laser lithotripsy of the left ureteral calculus completed on Sep 01, 2020 under the care of Dr. Gloriann Loan  He tolerated the procedure well but she continues to have worsening renal failure of unknown etiology and have hydronephrosis.  She denies any bone pain or pathological fractures.  She denies any hospitalizations or illnesses.  Performance status quality of life remained reasonable.    Medications: I have reviewed the patient's current medications.  Current Outpatient Medications  Medication Sig Dispense Refill   chlorthalidone (HYGROTON) 25 MG tablet Take 12.5 mg by mouth daily.     diltiazem (TIAZAC) 240 MG 24 hr capsule Take 240 mg by mouth daily.     furosemide (LASIX) 40 MG tablet Take 40 mg by mouth.     HYDROcodone-acetaminophen (NORCO/VICODIN) 5-325 MG tablet Take 1 tablet by mouth every 4 (four) hours as needed for moderate pain. 12 tablet 0   labetalol (NORMODYNE) 200 MG tablet Take 200 mg by mouth 2 (two) times daily.     tamsulosin (FLOMAX) 0.4 MG CAPS capsule Take 1 capsule (0.4 mg total) by mouth daily. 10 capsule 1   No current facility-administered medications for this visit.     Allergies: No Known Allergies    Physical Exam: Blood pressure (!) 175/98, pulse (!) 58, temperature 98.6 F (37 C), temperature source Oral, resp. rate 17, height 5' 3"  (1.6 m), weight 184 lb 4.8 oz (83.6 kg), SpO2 99  %.  ECOG: 0    General appearance: Alert, awake without any distress. Head: Atraumatic without abnormalities Oropharynx: Without any thrush or ulcers. Eyes: No scleral icterus. Lymph nodes: No lymphadenopathy noted in the cervical, supraclavicular, or axillary nodes Heart:regular rate and rhythm, without any murmurs or gallops.   Lung: Clear to auscultation without any rhonchi, wheezes or dullness to percussion. Abdomin: Soft, nontender without any shifting dullness or ascites. Musculoskeletal: No clubbing or cyanosis. Neurological: No motor or sensory deficits. Skin: No rashes or lesions.     Lab Results: Lab Results  Component Value Date   HGB 14.3 09/01/2020   HCT 42.0 09/01/2020     Chemistry      Component Value Date/Time   NA 139 09/01/2020 1239   K 3.3 (L) 09/01/2020 1239   CL 99 09/01/2020 1239   BUN 56 (H) 09/01/2020 1239   CREATININE 3.60 (H) 09/01/2020 1239   No results found for: CALCIUM, ALKPHOS, AST, ALT, BILITOT       Impression and Plan:  50 year old with:   1.  Abnormal serum light chains detected in January 2022 with elevated serum kappa and lambda with normal ratio.  The differential diagnosis of these findings were reviewed at this time.  Elevation related to chronic renal failure is the most likely etiology most likely a plasma cell disorder.  I will update her protein studies today to further evaluate for possible plasma cell disorder.  We will repeat serum protein electrophoresis, serum light chains at this time.  Bone marrow biopsy and skeletal survey would be added if a monoclonal protein is detected.   2.  Renal failure: Unclear etiology.  Plasma cell disorder is considered less likely but will complete evaluation.  She is under consideration for renal transplant.   3.  Follow-up: To be determined pending the results of her work-up.  We will contact her with the results of her testing today.     30 minutes were spent on this encounter.   Time was dedicated to reviewing laboratory data, disease status update, differential diagnosis and management options in the future.     Zola Button, MD 9/22/20228:38 AM

## 2021-01-22 NOTE — Telephone Encounter (Signed)
CRITICAL VALUE STICKER  CRITICAL VALUE: Creatnine 3.14  RECEIVER (on-site recipient of call): Zannie Cove RN, J. Jahmeer Porche RN  DATE & TIME NOTIFIED: 01/22/21 @ 0959  MESSENGER (representative from lab): Pam  MD NOTIFIED: Dr. Alen Blew  TIME OF NOTIFICATION:1007  RESPONSE: MD aware

## 2021-01-23 LAB — KAPPA/LAMBDA LIGHT CHAINS
Kappa free light chain: 102.8 mg/L — ABNORMAL HIGH (ref 3.3–19.4)
Kappa, lambda light chain ratio: 1.42 (ref 0.26–1.65)
Lambda free light chains: 72.4 mg/L — ABNORMAL HIGH (ref 5.7–26.3)

## 2021-01-26 LAB — MULTIPLE MYELOMA PANEL, SERUM
Albumin SerPl Elph-Mcnc: 3.7 g/dL (ref 2.9–4.4)
Albumin/Glob SerPl: 1 (ref 0.7–1.7)
Alpha 1: 0.3 g/dL (ref 0.0–0.4)
Alpha2 Glob SerPl Elph-Mcnc: 1 g/dL (ref 0.4–1.0)
B-Globulin SerPl Elph-Mcnc: 1.2 g/dL (ref 0.7–1.3)
Gamma Glob SerPl Elph-Mcnc: 1.5 g/dL (ref 0.4–1.8)
Globulin, Total: 4 g/dL — ABNORMAL HIGH (ref 2.2–3.9)
IgA: 360 mg/dL — ABNORMAL HIGH (ref 87–352)
IgG (Immunoglobin G), Serum: 1353 mg/dL (ref 586–1602)
IgM (Immunoglobulin M), Srm: 138 mg/dL (ref 26–217)
Total Protein ELP: 7.7 g/dL (ref 6.0–8.5)

## 2021-02-06 ENCOUNTER — Encounter: Payer: Self-pay | Admitting: Gastroenterology

## 2021-02-11 ENCOUNTER — Telehealth: Payer: Self-pay | Admitting: *Deleted

## 2021-02-11 NOTE — Telephone Encounter (Signed)
I have spoken to patient to advise that our providers feel she will be better served seeing her in the office prior to scheduling colonoscopy. I advised that due to some of her ongoing health concerns, we need to make sure she is an appropriate candidate for procedure. She is scheduled with Dr Carlean Purl for 02/16/21. Patient verbalizes understanding of this.

## 2021-02-11 NOTE — Telephone Encounter (Signed)
Dr Silverio Decamp- This patient is scheduled for screening colonoscopy on 02/27/21 with you and is scheduled for previsit via televisit 02/13/21. In going through her chart, it appears she has renal failure and is being worked up for possible transplant. She also has history of cardiomyopathy (as per Anesthesia note dated 09/01/20). Last Echo per Anesthesia was 2021 and showed concentric LVH with EF 70%. Valves ok. Unfortunately, I am unable to locate concrete evidence of the cardiomyopathy or echo- again, this information is noted in anesthesia reports. We have not previously seen patient.   Can this patient be completed in [place] or do they need hospital procedure?

## 2021-02-11 NOTE — Telephone Encounter (Signed)
Please schedule office visit with next available provider prior to scheduling colonoscopy. Thanks

## 2021-02-16 ENCOUNTER — Ambulatory Visit (INDEPENDENT_AMBULATORY_CARE_PROVIDER_SITE_OTHER): Payer: 59 | Admitting: Internal Medicine

## 2021-02-16 ENCOUNTER — Encounter: Payer: Self-pay | Admitting: Internal Medicine

## 2021-02-16 VITALS — BP 170/90 | HR 56 | Ht 63.0 in | Wt 184.0 lb

## 2021-02-16 DIAGNOSIS — N189 Chronic kidney disease, unspecified: Secondary | ICD-10-CM

## 2021-02-16 DIAGNOSIS — Z1211 Encounter for screening for malignant neoplasm of colon: Secondary | ICD-10-CM | POA: Diagnosis not present

## 2021-02-16 DIAGNOSIS — I429 Cardiomyopathy, unspecified: Secondary | ICD-10-CM

## 2021-02-16 MED ORDER — PLENVU 140 G PO SOLR: 1.0000 | ORAL | Status: DC

## 2021-02-16 NOTE — Patient Instructions (Signed)
You have been scheduled for a colonoscopy. Please follow written instructions given to you at your visit today.  Please pick up your prep supplies at the pharmacy within the next 1-3 days. If you use inhalers (even only as needed), please bring them with you on the day of your procedure.   We are going to request your records from Dr Darrick Penna. Brigitte Pulse for review.   If you are age 51 or older, your body mass index should be between 23-30. Your Body mass index is 32.59 kg/m. If this is out of the aforementioned range listed, please consider follow up with your Primary Care Provider.  If you are age 47 or younger, your body mass index should be between 19-25. Your Body mass index is 32.59 kg/m. If this is out of the aformentioned range listed, please consider follow up with your Primary Care Provider.   __________________________________________________________  The [facility] providers would like to encourage you to use [facility] to communicate with providers for non-urgent requests or questions.  Due to long hold times on the telephone, sending your provider a message by [facility] may be a faster and more efficient way to get a response.  Please allow 48 business hours for a response.  Please remember that this is for non-urgent requests.    I appreciate the opportunity to care for you. Silvano Rusk, MD, [facility]

## 2021-02-16 NOTE — Progress Notes (Signed)
Heidi Harrison 50 y.o. Apr 03, 1971 [id number]  Assessment & Plan:   Encounter Diagnoses  Name Primary?   Colon cancer screening Yes   Chronic kidney disease, unspecified CKD stage    Cardiomyopathy, unspecified type ([place]) ?    Based upon everything I know I think she is an appropriate candidate to have a screening colonoscopy and that is acceptable to perform this in our ambulatory endoscopy center.The risks and benefits as well as alternatives of endoscopic procedure(s) have been discussed and reviewed. All questions answered. The patient agrees to proceed. I will clarify by obtaining records from her cardiologist.  Pending that things could change but I do think she is an acceptable ambulatory endoscopy center candidate with respect to her medical problems.  CC: Heidi Huh, NP   Subjective:   Chief Complaint: Screening colonoscopy with kidney disease and question heart disease  HPI 50 year old white woman interested in screening colonoscopy in the setting of newly diagnosed chronic kidney disease and reports of cardiomyopathy in her chart.  During our chart screening process for an open access direct colonoscopy this was picked up and an office visit to sort out was recommended.  We could not find documentation other than what was seen in an anesthesia note from earlier this year (see below) indicating her EF of 70%, and other than having a diagnosis of cardiomyopathy in her chart.  She was found to have bilateral obstructive uropathy thought perhaps due to kidney stones, but after Dr. Gloriann Harrison was able to extract the stones and relieve her hydronephrosis her kidney function did not improve.  She has been undergoing a work-up.  She had significant elevated blood pressure problems and saw a Dr. Sharee Harrison of [facility] medical [facility], a cardiologist last year and this year.  She reports that she does not have any dyspnea on exertion chest pain issues orthopnea or signs of heart failure.   She is not recalling being told she had a cardiomyopathy only that her heart was thickened and a little bit abnormal in some of the muscle.   As far as her kidney failure she is a potential transplant candidate and she has started a work-up for that.  She makes urine and her creatinine is in the 3 range.  GFR 17.  She has had initial work-up for multiple myeloma though that is not thought to be the case.  Etiology of her kidney failure is not determined at this point, and biopsy is problematic due to edema or changes in the tissues of the kidney, she reports.    09/01/20 anesthesia note ECHO 21'  Concentric LVH with EF 70% Valves ok    No Known Allergies Current Meds  Medication Sig   chlorthalidone (HYGROTON) 25 MG tablet Take 12.5 mg by mouth daily.   diltiazem (TIAZAC) 240 MG 24 hr capsule Take 240 mg by mouth daily.   furosemide (LASIX) 40 MG tablet Take 40 mg by mouth as needed.   metoprolol succinate (TOPROL-XL) 25 MG 24 hr tablet Take 25 mg by mouth daily.   [DISCONTINUED] PEG-KCl-NaCl-NaSulf-Na Asc-C (PLENVU) 140 g SOLR Take 1 kit by mouth as directed.   Past Medical History:  Diagnosis Date   Cardiomyopathy [facility]) ?    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   Hypertension    Left ureteral stone    Past Surgical History:  Procedure Laterality Date   CARPAL TUNNEL RELEASE Right 30  yrs ago   CYSTOSCOPY/URETEROSCOPY/HOLMIUM LASER/STENT PLACEMENT Left 09/01/2020   Procedure: CYSTOSCOPY LEFT URETEROSCOPY/ RETROGRADE / HOLMIUM LASER LITHOTRIPSY/ STONE BASKETRY/ STENT PLACEMENT;  Surgeon: Lucas Mallow, MD;  [facility];  Service: Urology;  Laterality: Left;   HOLMIUM LASER APPLICATION Left 10/03/6946   Procedure: HOLMIUM LASER APPLICATION;  Surgeon: Lucas Mallow, MD;  Location: [facility];  Service: Urology;  Laterality: Left;   Social History   Social History  Narrative   Significant other is Corporate treasurer   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

## 2021-02-27 ENCOUNTER — Encounter: Payer: 59 | Admitting: Gastroenterology

## 2021-03-30 ENCOUNTER — Ambulatory Visit (AMBULATORY_SURGERY_CENTER): Payer: 59 | Admitting: Internal Medicine

## 2021-03-30 ENCOUNTER — Encounter: Payer: Self-pay | Admitting: Internal Medicine

## 2021-03-30 VITALS — BP 162/70 | HR 54 | Temp 97.8°F | Resp 12 | Ht 63.0 in | Wt 184.0 lb

## 2021-03-30 DIAGNOSIS — Z86010 Personal history of colonic polyps: Secondary | ICD-10-CM

## 2021-03-30 DIAGNOSIS — D120 Benign neoplasm of cecum: Secondary | ICD-10-CM

## 2021-03-30 DIAGNOSIS — Z1211 Encounter for screening for malignant neoplasm of colon: Secondary | ICD-10-CM

## 2021-03-30 DIAGNOSIS — Z860101 Personal history of adenomatous and serrated colon polyps: Secondary | ICD-10-CM

## 2021-03-30 HISTORY — DX: Personal history of adenomatous and serrated colon polyps: Z86.0101

## 2021-03-30 HISTORY — DX: Personal history of colonic polyps: Z86.010

## 2021-03-30 HISTORY — PX: COLONOSCOPY W/ POLYPECTOMY: SHX1380

## 2021-03-30 MED ORDER — SODIUM CHLORIDE 0.9 % IV SOLN: 500.0000 mL | Freq: Once | INTRAVENOUS | Status: DC

## 2021-03-30 NOTE — Patient Instructions (Addendum)
I found and removed one tiny polyp.  I also saw some external hemorrhoids.  I will let you know pathology results and when to have another routine colonoscopy by mail and/or My Chart.  I appreciate the opportunity to care for you. Gatha Mayer, MD, [facility]   Thank you for allowing [place] to take care of your healthcare needs.  Please review all handouts given to you from your recovery room nurse.   YOU HAD AN ENDOSCOPIC PROCEDURE TODAY AT [facility]:   Refer to the procedure report that was given to you for any specific questions about what was found during the examination.  If the procedure report does not answer your questions, please call your gastroenterologist to clarify.  If you requested that your care partner not be given the details of your procedure findings, then the procedure report has been included in a sealed envelope for you to review at your convenience later.  YOU SHOULD EXPECT: Some feelings of bloating in the abdomen. Passage of more gas than usual.  Walking can help get rid of the air that was put into your GI tract during the procedure and reduce the bloating. If you had a lower endoscopy (such as a colonoscopy or flexible sigmoidoscopy) you may notice spotting of blood in your stool or on the toilet paper. If you underwent a bowel prep for your procedure, you may not have a normal bowel movement for a few days.  Please Note:  You might notice some irritation and congestion in your nose or some drainage.  This is from the oxygen used during your procedure.  There is no need for concern and it should clear up in a day or so.  SYMPTOMS TO REPORT IMMEDIATELY:  Following lower endoscopy (colonoscopy or flexible sigmoidoscopy):  Excessive amounts of blood in the stool  Significant tenderness or worsening of abdominal pains  Swelling of the abdomen that is new, acute  Fever of 100F or higher  For urgent or emergent issues, a gastroenterologist can be reached  at any hour by calling [phone]. Do not use MyChart messaging for urgent concerns.    DIET:  We do recommend a small meal at first, but then you may proceed to your regular diet.  Drink plenty of fluids but you should avoid alcoholic beverages for 24 hours.  ACTIVITY:  You should plan to take it easy for the rest of today and you should NOT DRIVE or use heavy machinery until tomorrow (because of the sedation medicines used during the test).    FOLLOW UP: Our staff will call the number listed on your records 48-72 hours following your procedure to check on you and address any questions or concerns that you may have regarding the information given to you following your procedure. If we do not reach you, we will leave a message.  We will attempt to reach you two times.  During this call, we will ask if you have developed any symptoms of COVID 19. If you develop any symptoms (ie: fever, flu-like symptoms, shortness of breath, cough etc.) before then, please call [phone].  If you test positive for Covid 19 in the 2 weeks post procedure, please call and report this information to [place].    If any biopsies were taken you will be contacted by phone or by letter within the next 1-3 weeks.  Please call us at [phone] if you have not heard about the biopsies in 3 weeks.  SIGNATURES/CONFIDENTIALITY: You and/or your care partner have signed paperwork which will be entered into your electronic medical record.  These signatures attest to the fact that that the information above on your After Visit Summary has been reviewed and is understood.  Full responsibility of the confidentiality of this discharge information lies with you and/or your care-partner.

## 2021-03-30 NOTE — Progress Notes (Signed)
Called to room to assist during endoscopic procedure.  Patient ID and intended procedure confirmed with present staff. Received instructions for my participation in the procedure from the performing physician.  

## 2021-03-30 NOTE — Progress Notes (Signed)
Report to PACU, RN, vss, BBS= Clear.  

## 2021-03-30 NOTE — Op Note (Signed)
[facility] Patient Name: Heidi Harrison Procedure Date: 03/30/2021 1:07 PM MRN: [id number] Endoscopist: Gatha Mayer , MD Age: 50 Referring MD:  Date of Birth: [birth year] Gender: Female Account #: [id number] Procedure:                Colonoscopy Indications:              Screening for colorectal malignant neoplasm, This                            is the patient's first colonoscopy Medicines:                Propofol per Anesthesia, Monitored Anesthesia Care Procedure:                Pre-Anesthesia Assessment:                           - Prior to the procedure, a History and Physical                            was performed, and patient medications and                            allergies were reviewed. The patient's tolerance of                            previous anesthesia was also reviewed. The risks                            and benefits of the procedure and the sedation                            options and risks were discussed with the patient.                            All questions were answered, and informed consent                            was obtained. Prior Anticoagulants: The patient has                            taken no previous anticoagulant or antiplatelet                            agents. ASA Grade Assessment: II - A patient with                            mild systemic disease. After reviewing the risks                            and benefits, the patient was deemed in                            satisfactory condition to undergo the procedure.  After obtaining informed consent, the colonoscope                            was passed under direct vision. Throughout the                            procedure, the patient's blood pressure, pulse, and                            oxygen saturations were monitored continuously. The                            Olympus CF-HQ190L ([id number]) Colonoscope was                             introduced through the anus and advanced to the the                            cecum, identified by appendiceal orifice and                            ileocecal valve. The colonoscopy was performed                            without difficulty. The patient tolerated the                            procedure well. The quality of the bowel                            preparation was good. The ileocecal valve,                            appendiceal orifice, and rectum were photographed.                            The bowel preparation used was Plenvu via split                            dose instruction. Scope In: 2:10:39 PM Scope Out: 2:27:39 PM Scope Withdrawal Time: 0 hours 13 minutes 48 seconds  Total Procedure Duration: 0 hours 17 minutes 0 seconds  Findings:                 The perianal and digital rectal examinations were                            normal.                           A diminutive polyp was found in the cecum. The                            polyp was sessile. The polyp was removed with a  cold snare. Resection and retrieval were complete.                            Verification of patient identification for the                            specimen was done. Estimated blood loss was minimal.                           External hemorrhoids were found, w/ associated                            polypoid change of one column..                           The exam was otherwise without abnormality on                            direct and retroflexion views. Complications:            No immediate complications. Estimated Blood Loss:     Estimated blood loss was minimal. Impression:               - One diminutive polyp in the cecum, removed with a                            cold snare. Resected and retrieved.                           - External hemorrhoids and innocent-appearing                            polypoid change also..                           -  The examination was otherwise normal on direct                            and retroflexion views. Recommendation:           - Patient has a contact number available for                            emergencies. The signs and symptoms of potential                            delayed complications were discussed with the                            patient. Return to normal activities tomorrow.                            Written discharge instructions were provided to the                            patient.                           -  Resume previous diet.                           - Continue present medications.                           - Repeat colonoscopy is recommended. The                            colonoscopy date will be determined after pathology                            results from today's exam become available for                            review. Gatha Mayer, MD 03/30/2021 2:35:11 PM This report has been signed electronically.

## 2021-03-30 NOTE — Progress Notes (Signed)
VS completed by DT.  Pt's states no medical or surgical changes since previsit or office visit.  

## 2021-03-30 NOTE — Progress Notes (Signed)
[facility] History and Physical   Primary Care Physician:  [name], NP   Reason for Procedure:   Colon cancer screening  Plan:    colonoscopy     HPI: Heidi Harrison is a 50 y.o. female here for for screening colonoscopy   Past Medical History:  Diagnosis Date   Cardiomyopathy [facility]) ?    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   Hypertension    Left ureteral stone     Past Surgical History:  Procedure Laterality Date   CARPAL TUNNEL RELEASE Right 30 yrs ago   CYSTOSCOPY/URETEROSCOPY/HOLMIUM LASER/STENT PLACEMENT Left 09/01/2020   Procedure: CYSTOSCOPY LEFT URETEROSCOPY/ RETROGRADE / HOLMIUM LASER LITHOTRIPSY/ STONE BASKETRY/ STENT PLACEMENT;  Surgeon: Lucas Mallow, MD;  Location: [place];  Service: Urology;  Laterality: Left;   HOLMIUM LASER APPLICATION Left 05/08/1094   Procedure: HOLMIUM LASER APPLICATION;  Surgeon: Lucas Mallow, MD;  Location: [facility];  Service: Urology;  Laterality: Left;    Prior to Admission medications   Medication Sig Start Date End Date Taking? Authorizing Provider  chlorthalidone (HYGROTON) 25 MG tablet Take 12.5 mg by mouth daily.   Yes [provider]  diltiazem (TIAZAC) 240 MG 24 hr capsule Take 240 mg by mouth daily.   Yes [provider]  furosemide (LASIX) 40 MG tablet Take 40 mg by mouth as needed.   Yes [provider]  metoprolol succinate (TOPROL-XL) 25 MG 24 hr tablet Take 25 mg by mouth daily. 01/11/21  Yes [provider]    Current Outpatient Medications  Medication Sig Dispense Refill   chlorthalidone (HYGROTON) 25 MG tablet Take 12.5 mg by mouth daily.     diltiazem (TIAZAC) 240 MG 24 hr capsule Take 240 mg by mouth daily.     furosemide (LASIX) 40 MG tablet Take 40 mg by mouth as needed.     metoprolol succinate (TOPROL-XL) 25 MG 24 hr tablet Take 25 mg by mouth  daily.     Current Facility-Administered Medications  Medication Dose Route Frequency Provider Last Rate Last Admin   0.9 %  sodium chloride infusion  500 mL Intravenous Once Gatha Mayer, MD        Allergies as of 03/30/2021   (No Known Allergies)    History reviewed. No pertinent family history.  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  Physical Exam: Vital signs BP (!) 179/85   Pulse (!) 58   Temp 97.8  F (36.6 C) (Temporal)   Resp 18   Ht 5\' 3"  (1.6 m)   Wt 184 lb (83.5 kg)   SpO2 100%   BMI 32.59 kg/m   General:   Alert,  Well-developed, well-nourished, pleasant and cooperative in NAD Lungs:  Clear throughout to auscultation.   Heart:  Regular rate and rhythm; no murmurs, clicks, rubs,  or gallops. Abdomen:  Soft, nontender and nondistended. Normal bowel sounds.   Neuro/Psych:  Alert and cooperative. Normal mood and affect. A and O x 3   @[place]  Simonne Maffucci, MD, [facility] [phone] (pager) 03/30/2021 2:01 PM@

## 2021-04-06 ENCOUNTER — Encounter: Payer: Self-pay | Admitting: Internal Medicine

## 2021-10-06 ENCOUNTER — Encounter: Payer: Self-pay | Admitting: Vascular Surgery

## 2021-10-07 ENCOUNTER — Ambulatory Visit: Payer: BC Managed Care – PPO | Admitting: Vascular Surgery

## 2021-10-07 ENCOUNTER — Encounter: Payer: Self-pay | Admitting: Vascular Surgery

## 2021-10-07 VITALS — BP 162/86 | HR 50 | Temp 98.4°F | Resp 20 | Ht 63.0 in | Wt 170.9 lb

## 2021-10-07 DIAGNOSIS — N184 Chronic kidney disease, stage 4 (severe): Secondary | ICD-10-CM | POA: Diagnosis not present

## 2021-10-07 NOTE — Progress Notes (Signed)
Patient ID: [name], female   DOB: 1970-07-13, 51 y.o.   MRN: [id number]  Reason for Consult: No chief complaint on file.   Referred by [name], NP  Subjective:     HPI:  Heidi Harrison is a 51 y.o. female with CKD 4 never been on dialysis before.  She is currently on transplant list at [facility].  She is here today to discuss peritoneal dialysis catheter placement.  She has never had any abdominal surgeries in the past.  Past Medical History:  Diagnosis Date   Cardiomyopathy [facility]) ?    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 Past Surgical History:  Procedure Laterality Date   CARPAL TUNNEL RELEASE Right 30 yrs ago   COLONOSCOPY W/ POLYPECTOMY  03/30/2021   CYSTOSCOPY/URETEROSCOPY/HOLMIUM LASER/STENT PLACEMENT Left 09/01/2020   Procedure: CYSTOSCOPY LEFT URETEROSCOPY/ RETROGRADE / HOLMIUM LASER LITHOTRIPSY/ STONE BASKETRY/ STENT PLACEMENT;  Surgeon: Lucas Mallow, MD;  Location: [place];  Service: Urology;  Laterality: Left;   HOLMIUM LASER APPLICATION Left 41/74/0814   Procedure: HOLMIUM LASER APPLICATION;  Surgeon: Lucas Mallow, MD;  Location: [facility];  Service: Urology;  Laterality: Left;    Short Social History:  Social History   Tobacco Use   Smoking status: Never   Smokeless tobacco: Never  Substance Use Topics   Alcohol use: Yes    Comment: social    No Known Allergies  Current Outpatient Medications  Medication Sig Dispense Refill   chlorthalidone (HYGROTON) 25 MG tablet Take 12.5 mg by mouth daily.     diltiazem (TIAZAC) 240 MG 24 hr capsule Take 240 mg by mouth daily.     furosemide (LASIX) 40 MG tablet Take 40 mg by mouth as needed.     metoprolol succinate (TOPROL-XL) 25 MG 24 hr  tablet Take 25 mg by mouth daily.     No current facility-administered medications for this visit.    Review of Systems  Constitutional:  Constitutional negative. HENT: HENT negative.  Eyes: Eyes negative.  Respiratory: Respiratory negative.  Cardiovascular: Cardiovascular negative.  GI: Gastrointestinal negative.  Musculoskeletal: Musculoskeletal negative.  Skin: Skin negative.  Neurological: Neurological negative. Hematologic: Hematologic/lymphatic negative.  Psychiatric: Psychiatric negative.       Objective:  Objective  Vitals:   10/07/21 1015  BP: (!) 162/86  Pulse: (!) 50  Resp: 20  Temp: 98.4 F (36.9 C)  SpO2: 95%     Physical Exam HENT:     Head: Normocephalic.     Nose: Nose normal.  Eyes:     Pupils: Pupils are equal, round, and reactive to light.  Cardiovascular:     Rate and Rhythm: Normal rate.  Pulmonary:     Effort: Pulmonary effort is normal.  Abdominal:     General: Abdomen is flat.  Musculoskeletal:        General: Normal range of motion.     Cervical back: Normal range of motion and neck supple.     Right lower leg: No edema.     Left lower leg: No edema.  Skin:    General: Skin is warm and dry.     Capillary Refill: Capillary refill takes less than 2 seconds.  Neurological:  General: No focal deficit present.     Mental Status: She is alert.  Psychiatric:        Mood and Affect: Mood normal.        Behavior: Behavior normal.        Thought Content: Thought content normal.    Data: No studies     Assessment/Plan:    52 year old female with CKD 4 here to discuss peritoneal dialysis catheter placement.  She does appear to be a good candidate but currently is not in need of urgent dialysis.  She will call to schedule if the need arises.     Waynetta Sandy MD [facility]

## 2021-11-05 IMAGING — NM NM RENAL IMAGING FLOW W/ PHARM
2 series · 12 of 12 positions shown · non-contrast
Comparison: CT abdomen and pelvis 07/25/2020

CLINICAL DATA: LEFT hydronephrosis with UPJ obstructing a kidney
stone

EXAM:
NUCLEAR MEDICINE RENAL SCAN WITH DIURETIC ADMINISTRATION
TECHNIQUE: Radionuclide angiographic and sequential renal images were obtained
after intravenous injection of radiopharmaceutical. Imaging was
continued during slow intravenous injection of Lasix approximately
15 minutes after the start of the examination.
RADIOPHARMACEUTICALS:  4.8 mCi Iechnetium-00m MAG3 IV
Pharmaceutical: 40 mg Lasix IV

[Series 1: renal scan · 4.14mm/px · 6 of 40 frames shown (1 of 2)]
[frame 4/40  full-range]
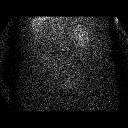
[frame 10/40  full-range]
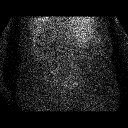
[frame 17/40  full-range]
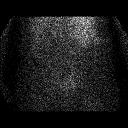
[frame 24/40  full-range]
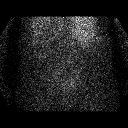
[frame 30/40  full-range]
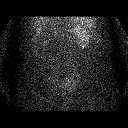
[frame 37/40  full-range]
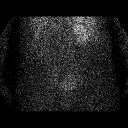

[Series 1: renal scan · 4.14mm/px · 6 of 81 frames shown (2 of 2)]
[frame 7/81]
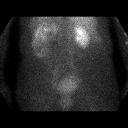
[frame 21/81]
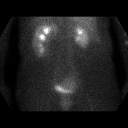
[frame 34/81]
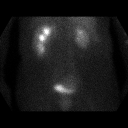
[frame 48/81]
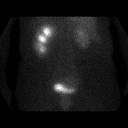
[frame 61/81]
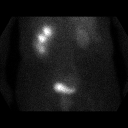
[frame 75/81]
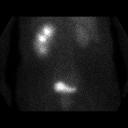

[12 of 12 positions shown; findings below may reference images not displayed]

FINDINGS: Flow: Normal blood flow to RIGHT kidney. Slightly delayed flow to
LEFT kidney.

Left renogram: Delayed uptake, concentration and excretion of tracer
by LEFT kidney. Tracer is excreted into a dilated renal collecting
system. Partial washout of tracer occurs from the collecting system
before and continuing following Lasix administration. Some residual
tracer is seen within the dilated collecting system at the
conclusion of the exam. Analysis of the renogram curve demonstrates
a delayed time to peak activity of 8.2 minutes with fall to half
maximum activity 28.4 minutes later.

Right renogram: Poor uptake, concentration, and excretion of tracer
by RIGHT kidney. Only minimal collecting system tracer is
identified. No enhanced clearance of tracer from the RIGHT kidney
following diuretic administration. Abnormal retained tracer within
kidney at the conclusion of the exam. Analysis of the renogram curve
demonstrates a continually increasing curved with failure to
demonstrate appreciable fall over the course of the exam.

Differential:

Left kidney = 57 %

Right kidney = 43 %

T1/2 post Lasix :

Left kidney = 30.7 min

Right kidney = N/A min
IMPRESSION: LEFT hydronephrosis with partial clearance of tracer before and
continuing following Lasix administration, with adequate fall to
half maximum activity following diuresis.

Significantly impaired RIGHT renal function with poor
uptake/concentration/excretion of tracer and significantly retained
tracer at the conclusion of the exam; continually increasing
renogram curve is identified over the course of the study.

## 2023-06-14 ENCOUNTER — Encounter: Payer: Self-pay | Admitting: Nephrology

## 2023-06-14 DIAGNOSIS — Z940 Kidney transplant status: Secondary | ICD-10-CM

## 2023-06-15 ENCOUNTER — Other Ambulatory Visit: Payer: Self-pay | Admitting: Nephrology

## 2023-06-15 ENCOUNTER — Ambulatory Visit
Admission: RE | Admit: 2023-06-15 | Discharge: 2023-06-15 | Disposition: A | Discharge status: Home / Self Care | Payer: 59 | Source: Ambulatory Visit | Attending: Nephrology | Admitting: Nephrology

## 2023-06-15 DIAGNOSIS — Z940 Kidney transplant status: Secondary | ICD-10-CM

## 2023-07-18 NOTE — Progress Notes (Addendum)
 Physical Therapy Visit - Daily Note   Payor: [employer] / Plan: [employer] [facility] / Product Type: POS /   Visit Count: 4  Rehabilitation Precautions/Restrictions:   Precautions/Restrictions Precautions: Hx of basal cell carcinoma and kidney transplant Restrictions: none noted       Referring Diagnosis: Sciatica, left side (M54.32)   SUBJECTIVE Patient Report:   Patient states she took a turn Saturday night and she has been feeling a lot better.  Change in Status Since Last Visit: No Pain:  Pain Assessment Pain Assessment: No/denies pain  OBJECTIVE  General Observation/Objective Measures: Patient in NAD, alert and oriented, ready for PT. No obvious gait deviations observed.              Interventions:  Therapeutic exercise:  Bike 6'  Nautilus leg press 150# 3x12  ER fall out blue band 2x15 B  Supine hip flexor stretch 3x45''  Lateral sidestep green band 15' x2 each way  Shuttle SL press 1 cord x15 B    Therapeutic Activity: Bridge with blue band 2x15 TRX alternating lunges x15 B     Education: Yes, as described in interventions   ASSESSMENT Patient is a 53 year old female who presents to OP PT with L sided anterior hip, groin and low back pain. Initiated Shuttle SL press and lunges which caused no increased pain. Progressed to bridging with band without difficulty. Patient endorses seeing improvements overall with her pain and mobility. Patient will benefit from continued skilled PT to address decreased ROM/flexibility, decreased strength, to continue to help improve pain, and to return patient to PLOF.    Therapy Diagnosis:     ICD-10-CM   1. Hip weakness  R29.898   2. Muscle tightness  M62.89     Progress Towards Goals:      Goals Addressed             This Visit's Progress   . PT Goal   On track    Goals Reviewed (date): 07/07/23  Short Term Goals: Timeframe to achieve short term goals: as noted  GOAL 1: Independent home  program to work on posture, ROM, strength, and self-management.  3 visits   Long Term Goals: Timeframe to achieve long term goals goals: per plan of care  GOAL 1: Patient will report at least a 75% reduction in anterior thigh pain to indicate a centralization of her pain.   GOAL 2: Patient will be able to sit in a chair for >1 hour without having to off load her L glute.   GOAL 3: Patient will be able to return to normal walking routine 2-3 days a week.   GOAL 4: Improve ODI outcome measure to < 25% impairment (was 54% at eval).  GOAL 5: Patient will be able to return to sleeping in her bed.           PLAN Treatment Frequency and Duration:  Treatment Plan Details: 2x a week tapering as necessary for a total of 9 visits  Recommended PT Treatment/Interventions: Dry needling (1-2 muscles) (79439); Dry Needling (3+ muscles) (79438); Manual therapy (97140); Neuromuscular re-education [phone]); Therapeutic activity (97530); Therapeutic exercise (97110); Vasopneumatic compression (02983)   Recommended Consults:  None currently  Development of Plan of Care:  No change in POC.  Total Treatment Time (Time & Untimed): Total Treatment minutes: 38 Total Time in Timed Codes: Timed Code Minutes: 38     PT Treatment/Procedure Therapeutic Exercises minutes: 28 Therapeutic Activities minutes: 10  The patient has been instructed to contact our clinic if any questions or problems should arise.

## 2023-08-19 NOTE — Progress Notes (Signed)
 HIGH RISK CUTANEOUS ONCOLOGY CLINIC  [facility] Department of Dermatology   Referring provider:  No referring provider defined for this encounter. Chief Complaint: Total body skin examination  Date of Visit: 08/19/2023 Last Visit: 02/24/2025  Subjective:  Heidi Harrison is a 53 y.o. female who presents as a return patient to the high risk cutaneous oncology clinic today for a complete evaluation of skin lesions and nevi.  Recent history of BCC of forehead and chest, s/p Mohs and excision.   Wart on right thumb that bothers her. History of wart on hand that required shave removal. Interested in treatment today, has not tried much OTC yet.   Transplant History:  Year: 2023, [facility]  Immunosuppression regimen: prograf, myfortic, prednisone   Preventive measures:  Sunscreen, sun avoidance   ROS: Negative for any other lumps, swelling, or dermatologic conditions. Negative for fever, chills, or unintentional weight loss.  Past Medical History: Past Medical History:  Diagnosis Date  . Basal cell carcinoma   . Shingles   . Skin tag   . Verruca     Family History: Family history of skin cancer: Yes  Objective: Vital Signs:  BP 121/63 (BP Location: Right arm, Patient Position: Sitting)   Pulse 69   Ht 1.6 m (5' 3)   Wt 86.2 kg (190 lb)   BMI 33.66 kg/m   Patient is well nourished, well hydrated 53 y.o. female.  Fitzpatrick skin type 2 In no acute distress with appropriate mood and affect who appears alert and oriented. Exam today focused on the skin and included inspection and palpation of the following areas:  Scalp and hair Head (Face, Ears, Nose) Lips, teeth and gums Eyes (Eyelids and conjunctivae) Neck Chest (including breasts and axillae) Abdomen Back Right upper extremity Left upper extremity Palms, finger, and fingernails   Notable Findings include: Well healed sites on left chest and forehead s/p removal no evidence of recurrence.  Tan  and light brown irregularly shaped macules scattered on sun-exposed parts of the dorsal hands, forearms, and upper trunk  Skin colored papule on right thumb consistent with flat wart.    Assessment: 1. Plantar wart      2. Personal history of other malignant neoplasm of skin      3. Solar lentigo      4. Immunosuppression (HCC)          Plan: - The above diagnosis and treatment options were discussed with the patient. - Prescriptions provided today: none  - Cryotherapy discussed as a therapeutic option and patient informed of the risks (including permanent scarring, infection, light or dark discoloration, bleeding, infection, weakness, numbness and recurrence of the lesion). After verbal informed consent, the patient underwent cryotherapy of 1 wart. Lesion(s) were treated with liquid nitrogen therapy with at least a 5 second freeze-thaw cycle encompassing the appropriate margin of normal skin. This was repeated times 2 additional cycle(s).  The patient tolerated procedure well.    - Biopsies Performed: none - Recommend avoidance of direct sun during peak hours of sunscreens, protective clothing and a wide brimmed hat while out in the sun. - RTC 6 months

## 2024-09-06 ENCOUNTER — Ambulatory Visit: Admitting: Internal Medicine
